# Patient Record
Sex: Male | Born: 1945 | Race: White | Hispanic: No | Marital: Married | Smoking: Former smoker
Health system: Southern US, Community
[De-identification: ages and names within clinical notes are randomized; demographics above are authoritative.]

## PROBLEM LIST (undated history)

## (undated) DIAGNOSIS — E119 Type 2 diabetes mellitus without complications: Secondary | ICD-10-CM

## (undated) DIAGNOSIS — E7800 Pure hypercholesterolemia, unspecified: Secondary | ICD-10-CM

## (undated) DIAGNOSIS — I10 Essential (primary) hypertension: Secondary | ICD-10-CM

## (undated) HISTORY — PX: CARPAL TUNNEL RELEASE: SHX101

## (undated) HISTORY — PX: CATARACT EXTRACTION: SUR2

## (undated) HISTORY — PX: KNEE SURGERY: SHX244

## (undated) HISTORY — PX: TONSILLECTOMY: SUR1361

## (undated) HISTORY — PX: SHOULDER SURGERY: SHX246

---

## 2015-02-09 DIAGNOSIS — G4733 Obstructive sleep apnea (adult) (pediatric): Secondary | ICD-10-CM | POA: Diagnosis not present

## 2015-02-15 DIAGNOSIS — G4733 Obstructive sleep apnea (adult) (pediatric): Secondary | ICD-10-CM | POA: Diagnosis not present

## 2015-02-15 DIAGNOSIS — J3089 Other allergic rhinitis: Secondary | ICD-10-CM | POA: Diagnosis not present

## 2015-02-20 DIAGNOSIS — R7303 Prediabetes: Secondary | ICD-10-CM | POA: Diagnosis not present

## 2015-02-20 DIAGNOSIS — G2581 Restless legs syndrome: Secondary | ICD-10-CM | POA: Diagnosis not present

## 2015-02-20 DIAGNOSIS — M7522 Bicipital tendinitis, left shoulder: Secondary | ICD-10-CM | POA: Diagnosis not present

## 2015-02-20 DIAGNOSIS — M7521 Bicipital tendinitis, right shoulder: Secondary | ICD-10-CM | POA: Diagnosis not present

## 2015-02-20 DIAGNOSIS — R5382 Chronic fatigue, unspecified: Secondary | ICD-10-CM | POA: Diagnosis not present

## 2015-02-20 DIAGNOSIS — E559 Vitamin D deficiency, unspecified: Secondary | ICD-10-CM | POA: Diagnosis not present

## 2015-03-04 DIAGNOSIS — R6889 Other general symptoms and signs: Secondary | ICD-10-CM | POA: Diagnosis not present

## 2015-03-04 DIAGNOSIS — R509 Fever, unspecified: Secondary | ICD-10-CM | POA: Diagnosis not present

## 2015-03-04 DIAGNOSIS — R062 Wheezing: Secondary | ICD-10-CM | POA: Diagnosis not present

## 2015-03-12 DIAGNOSIS — G4733 Obstructive sleep apnea (adult) (pediatric): Secondary | ICD-10-CM | POA: Diagnosis not present

## 2015-04-09 DIAGNOSIS — G4733 Obstructive sleep apnea (adult) (pediatric): Secondary | ICD-10-CM | POA: Diagnosis not present

## 2015-04-15 DIAGNOSIS — G4733 Obstructive sleep apnea (adult) (pediatric): Secondary | ICD-10-CM | POA: Diagnosis not present

## 2015-04-23 DIAGNOSIS — G4733 Obstructive sleep apnea (adult) (pediatric): Secondary | ICD-10-CM | POA: Diagnosis not present

## 2015-04-26 DIAGNOSIS — G4733 Obstructive sleep apnea (adult) (pediatric): Secondary | ICD-10-CM | POA: Diagnosis not present

## 2015-05-01 DIAGNOSIS — J301 Allergic rhinitis due to pollen: Secondary | ICD-10-CM | POA: Diagnosis not present

## 2015-05-01 DIAGNOSIS — B9789 Other viral agents as the cause of diseases classified elsewhere: Secondary | ICD-10-CM | POA: Diagnosis not present

## 2015-05-01 DIAGNOSIS — L989 Disorder of the skin and subcutaneous tissue, unspecified: Secondary | ICD-10-CM | POA: Diagnosis not present

## 2015-05-01 DIAGNOSIS — Z1283 Encounter for screening for malignant neoplasm of skin: Secondary | ICD-10-CM | POA: Diagnosis not present

## 2015-05-01 DIAGNOSIS — J069 Acute upper respiratory infection, unspecified: Secondary | ICD-10-CM | POA: Diagnosis not present

## 2015-05-10 DIAGNOSIS — G4733 Obstructive sleep apnea (adult) (pediatric): Secondary | ICD-10-CM | POA: Diagnosis not present

## 2015-05-15 DIAGNOSIS — B078 Other viral warts: Secondary | ICD-10-CM | POA: Diagnosis not present

## 2015-05-15 DIAGNOSIS — X32XXXA Exposure to sunlight, initial encounter: Secondary | ICD-10-CM | POA: Diagnosis not present

## 2015-05-15 DIAGNOSIS — D225 Melanocytic nevi of trunk: Secondary | ICD-10-CM | POA: Diagnosis not present

## 2015-05-15 DIAGNOSIS — Z1283 Encounter for screening for malignant neoplasm of skin: Secondary | ICD-10-CM | POA: Diagnosis not present

## 2015-05-15 DIAGNOSIS — L570 Actinic keratosis: Secondary | ICD-10-CM | POA: Diagnosis not present

## 2015-06-09 DIAGNOSIS — G4733 Obstructive sleep apnea (adult) (pediatric): Secondary | ICD-10-CM | POA: Diagnosis not present

## 2015-07-08 DIAGNOSIS — M9903 Segmental and somatic dysfunction of lumbar region: Secondary | ICD-10-CM | POA: Diagnosis not present

## 2015-07-08 DIAGNOSIS — M545 Low back pain: Secondary | ICD-10-CM | POA: Diagnosis not present

## 2015-07-08 DIAGNOSIS — M546 Pain in thoracic spine: Secondary | ICD-10-CM | POA: Diagnosis not present

## 2015-07-08 DIAGNOSIS — M9902 Segmental and somatic dysfunction of thoracic region: Secondary | ICD-10-CM | POA: Diagnosis not present

## 2015-07-10 DIAGNOSIS — G4733 Obstructive sleep apnea (adult) (pediatric): Secondary | ICD-10-CM | POA: Diagnosis not present

## 2015-07-10 DIAGNOSIS — M545 Low back pain: Secondary | ICD-10-CM | POA: Diagnosis not present

## 2015-07-10 DIAGNOSIS — M546 Pain in thoracic spine: Secondary | ICD-10-CM | POA: Diagnosis not present

## 2015-07-10 DIAGNOSIS — M9903 Segmental and somatic dysfunction of lumbar region: Secondary | ICD-10-CM | POA: Diagnosis not present

## 2015-07-10 DIAGNOSIS — M9902 Segmental and somatic dysfunction of thoracic region: Secondary | ICD-10-CM | POA: Diagnosis not present

## 2015-07-15 DIAGNOSIS — M9902 Segmental and somatic dysfunction of thoracic region: Secondary | ICD-10-CM | POA: Diagnosis not present

## 2015-07-15 DIAGNOSIS — M545 Low back pain: Secondary | ICD-10-CM | POA: Diagnosis not present

## 2015-07-15 DIAGNOSIS — M546 Pain in thoracic spine: Secondary | ICD-10-CM | POA: Diagnosis not present

## 2015-07-15 DIAGNOSIS — M9903 Segmental and somatic dysfunction of lumbar region: Secondary | ICD-10-CM | POA: Diagnosis not present

## 2015-07-17 DIAGNOSIS — M546 Pain in thoracic spine: Secondary | ICD-10-CM | POA: Diagnosis not present

## 2015-07-17 DIAGNOSIS — M545 Low back pain: Secondary | ICD-10-CM | POA: Diagnosis not present

## 2015-07-17 DIAGNOSIS — M9903 Segmental and somatic dysfunction of lumbar region: Secondary | ICD-10-CM | POA: Diagnosis not present

## 2015-07-17 DIAGNOSIS — M9902 Segmental and somatic dysfunction of thoracic region: Secondary | ICD-10-CM | POA: Diagnosis not present

## 2015-07-24 DIAGNOSIS — M546 Pain in thoracic spine: Secondary | ICD-10-CM | POA: Diagnosis not present

## 2015-07-24 DIAGNOSIS — M9903 Segmental and somatic dysfunction of lumbar region: Secondary | ICD-10-CM | POA: Diagnosis not present

## 2015-07-24 DIAGNOSIS — M9902 Segmental and somatic dysfunction of thoracic region: Secondary | ICD-10-CM | POA: Diagnosis not present

## 2015-07-24 DIAGNOSIS — M545 Low back pain: Secondary | ICD-10-CM | POA: Diagnosis not present

## 2015-07-29 DIAGNOSIS — M9903 Segmental and somatic dysfunction of lumbar region: Secondary | ICD-10-CM | POA: Diagnosis not present

## 2015-07-29 DIAGNOSIS — M9902 Segmental and somatic dysfunction of thoracic region: Secondary | ICD-10-CM | POA: Diagnosis not present

## 2015-07-29 DIAGNOSIS — M545 Low back pain: Secondary | ICD-10-CM | POA: Diagnosis not present

## 2015-07-29 DIAGNOSIS — M546 Pain in thoracic spine: Secondary | ICD-10-CM | POA: Diagnosis not present

## 2015-07-31 DIAGNOSIS — M9903 Segmental and somatic dysfunction of lumbar region: Secondary | ICD-10-CM | POA: Diagnosis not present

## 2015-07-31 DIAGNOSIS — M9902 Segmental and somatic dysfunction of thoracic region: Secondary | ICD-10-CM | POA: Diagnosis not present

## 2015-07-31 DIAGNOSIS — M545 Low back pain: Secondary | ICD-10-CM | POA: Diagnosis not present

## 2015-07-31 DIAGNOSIS — M546 Pain in thoracic spine: Secondary | ICD-10-CM | POA: Diagnosis not present

## 2015-07-31 DIAGNOSIS — G4733 Obstructive sleep apnea (adult) (pediatric): Secondary | ICD-10-CM | POA: Diagnosis not present

## 2015-08-06 DIAGNOSIS — G4733 Obstructive sleep apnea (adult) (pediatric): Secondary | ICD-10-CM | POA: Diagnosis not present

## 2015-08-07 DIAGNOSIS — M9902 Segmental and somatic dysfunction of thoracic region: Secondary | ICD-10-CM | POA: Diagnosis not present

## 2015-08-07 DIAGNOSIS — M546 Pain in thoracic spine: Secondary | ICD-10-CM | POA: Diagnosis not present

## 2015-08-07 DIAGNOSIS — M9903 Segmental and somatic dysfunction of lumbar region: Secondary | ICD-10-CM | POA: Diagnosis not present

## 2015-08-07 DIAGNOSIS — M545 Low back pain: Secondary | ICD-10-CM | POA: Diagnosis not present

## 2015-08-09 DIAGNOSIS — G4733 Obstructive sleep apnea (adult) (pediatric): Secondary | ICD-10-CM | POA: Diagnosis not present

## 2015-08-14 DIAGNOSIS — M545 Low back pain: Secondary | ICD-10-CM | POA: Diagnosis not present

## 2015-08-14 DIAGNOSIS — M546 Pain in thoracic spine: Secondary | ICD-10-CM | POA: Diagnosis not present

## 2015-08-14 DIAGNOSIS — M9902 Segmental and somatic dysfunction of thoracic region: Secondary | ICD-10-CM | POA: Diagnosis not present

## 2015-08-14 DIAGNOSIS — M9903 Segmental and somatic dysfunction of lumbar region: Secondary | ICD-10-CM | POA: Diagnosis not present

## 2015-09-09 DIAGNOSIS — G4733 Obstructive sleep apnea (adult) (pediatric): Secondary | ICD-10-CM | POA: Diagnosis not present

## 2015-10-02 DIAGNOSIS — R079 Chest pain, unspecified: Secondary | ICD-10-CM | POA: Diagnosis not present

## 2015-10-10 DIAGNOSIS — G4733 Obstructive sleep apnea (adult) (pediatric): Secondary | ICD-10-CM | POA: Diagnosis not present

## 2015-10-23 DIAGNOSIS — R079 Chest pain, unspecified: Secondary | ICD-10-CM | POA: Diagnosis not present

## 2015-10-24 DIAGNOSIS — Z23 Encounter for immunization: Secondary | ICD-10-CM | POA: Diagnosis not present

## 2015-11-12 DIAGNOSIS — G4733 Obstructive sleep apnea (adult) (pediatric): Secondary | ICD-10-CM | POA: Diagnosis not present

## 2015-11-21 DIAGNOSIS — E7801 Familial hypercholesterolemia: Secondary | ICD-10-CM | POA: Diagnosis not present

## 2015-12-23 DIAGNOSIS — J0140 Acute pansinusitis, unspecified: Secondary | ICD-10-CM | POA: Diagnosis not present

## 2015-12-23 DIAGNOSIS — J209 Acute bronchitis, unspecified: Secondary | ICD-10-CM | POA: Diagnosis not present

## 2015-12-23 DIAGNOSIS — J302 Other seasonal allergic rhinitis: Secondary | ICD-10-CM | POA: Diagnosis not present

## 2015-12-30 DIAGNOSIS — E119 Type 2 diabetes mellitus without complications: Secondary | ICD-10-CM | POA: Diagnosis not present

## 2015-12-30 DIAGNOSIS — E785 Hyperlipidemia, unspecified: Secondary | ICD-10-CM | POA: Diagnosis not present

## 2015-12-30 DIAGNOSIS — N401 Enlarged prostate with lower urinary tract symptoms: Secondary | ICD-10-CM | POA: Diagnosis not present

## 2015-12-30 DIAGNOSIS — Z9989 Dependence on other enabling machines and devices: Secondary | ICD-10-CM | POA: Diagnosis not present

## 2015-12-30 DIAGNOSIS — G4733 Obstructive sleep apnea (adult) (pediatric): Secondary | ICD-10-CM | POA: Diagnosis not present

## 2015-12-30 DIAGNOSIS — Z87898 Personal history of other specified conditions: Secondary | ICD-10-CM | POA: Diagnosis not present

## 2015-12-30 DIAGNOSIS — F419 Anxiety disorder, unspecified: Secondary | ICD-10-CM | POA: Diagnosis not present

## 2015-12-30 DIAGNOSIS — N138 Other obstructive and reflux uropathy: Secondary | ICD-10-CM | POA: Diagnosis not present

## 2015-12-30 DIAGNOSIS — E559 Vitamin D deficiency, unspecified: Secondary | ICD-10-CM | POA: Diagnosis not present

## 2015-12-30 DIAGNOSIS — I10 Essential (primary) hypertension: Secondary | ICD-10-CM | POA: Diagnosis not present

## 2016-01-01 DIAGNOSIS — E785 Hyperlipidemia, unspecified: Secondary | ICD-10-CM | POA: Diagnosis not present

## 2016-01-01 DIAGNOSIS — E559 Vitamin D deficiency, unspecified: Secondary | ICD-10-CM | POA: Diagnosis not present

## 2016-01-01 DIAGNOSIS — Z87898 Personal history of other specified conditions: Secondary | ICD-10-CM | POA: Diagnosis not present

## 2016-01-01 DIAGNOSIS — E119 Type 2 diabetes mellitus without complications: Secondary | ICD-10-CM | POA: Diagnosis not present

## 2016-01-01 DIAGNOSIS — I10 Essential (primary) hypertension: Secondary | ICD-10-CM | POA: Diagnosis not present

## 2016-01-01 DIAGNOSIS — N138 Other obstructive and reflux uropathy: Secondary | ICD-10-CM | POA: Diagnosis not present

## 2016-01-01 DIAGNOSIS — N401 Enlarged prostate with lower urinary tract symptoms: Secondary | ICD-10-CM | POA: Diagnosis not present

## 2016-01-01 DIAGNOSIS — Z125 Encounter for screening for malignant neoplasm of prostate: Secondary | ICD-10-CM | POA: Diagnosis not present

## 2016-01-28 DIAGNOSIS — M19071 Primary osteoarthritis, right ankle and foot: Secondary | ICD-10-CM | POA: Diagnosis not present

## 2016-01-29 DIAGNOSIS — M19071 Primary osteoarthritis, right ankle and foot: Secondary | ICD-10-CM | POA: Diagnosis not present

## 2016-02-06 DIAGNOSIS — R509 Fever, unspecified: Secondary | ICD-10-CM | POA: Diagnosis not present

## 2016-02-06 DIAGNOSIS — R05 Cough: Secondary | ICD-10-CM | POA: Diagnosis not present

## 2016-02-26 DIAGNOSIS — R9431 Abnormal electrocardiogram [ECG] [EKG]: Secondary | ICD-10-CM | POA: Diagnosis not present

## 2016-02-26 DIAGNOSIS — R0981 Nasal congestion: Secondary | ICD-10-CM | POA: Diagnosis not present

## 2016-02-26 DIAGNOSIS — R05 Cough: Secondary | ICD-10-CM | POA: Diagnosis not present

## 2016-02-26 DIAGNOSIS — R0602 Shortness of breath: Secondary | ICD-10-CM | POA: Diagnosis not present

## 2016-02-26 DIAGNOSIS — Z0000 Encounter for general adult medical examination without abnormal findings: Secondary | ICD-10-CM | POA: Diagnosis not present

## 2016-02-26 DIAGNOSIS — E559 Vitamin D deficiency, unspecified: Secondary | ICD-10-CM | POA: Diagnosis not present

## 2016-03-06 DIAGNOSIS — R05 Cough: Secondary | ICD-10-CM | POA: Diagnosis not present

## 2016-03-06 DIAGNOSIS — R0981 Nasal congestion: Secondary | ICD-10-CM | POA: Diagnosis not present

## 2016-03-12 DIAGNOSIS — E119 Type 2 diabetes mellitus without complications: Secondary | ICD-10-CM | POA: Diagnosis not present

## 2016-03-12 DIAGNOSIS — M21612 Bunion of left foot: Secondary | ICD-10-CM | POA: Diagnosis not present

## 2016-03-12 DIAGNOSIS — M21611 Bunion of right foot: Secondary | ICD-10-CM | POA: Diagnosis not present

## 2016-04-02 DIAGNOSIS — E119 Type 2 diabetes mellitus without complications: Secondary | ICD-10-CM | POA: Diagnosis not present

## 2016-04-02 DIAGNOSIS — Z961 Presence of intraocular lens: Secondary | ICD-10-CM | POA: Diagnosis not present

## 2016-04-02 DIAGNOSIS — H26492 Other secondary cataract, left eye: Secondary | ICD-10-CM | POA: Diagnosis not present

## 2016-04-02 DIAGNOSIS — H43393 Other vitreous opacities, bilateral: Secondary | ICD-10-CM | POA: Diagnosis not present

## 2016-04-02 DIAGNOSIS — H524 Presbyopia: Secondary | ICD-10-CM | POA: Diagnosis not present

## 2016-05-07 DIAGNOSIS — G4733 Obstructive sleep apnea (adult) (pediatric): Secondary | ICD-10-CM | POA: Diagnosis not present

## 2016-06-24 DIAGNOSIS — I10 Essential (primary) hypertension: Secondary | ICD-10-CM | POA: Diagnosis not present

## 2016-06-24 DIAGNOSIS — R7989 Other specified abnormal findings of blood chemistry: Secondary | ICD-10-CM | POA: Diagnosis not present

## 2016-06-24 DIAGNOSIS — E559 Vitamin D deficiency, unspecified: Secondary | ICD-10-CM | POA: Diagnosis not present

## 2016-06-24 DIAGNOSIS — Z0000 Encounter for general adult medical examination without abnormal findings: Secondary | ICD-10-CM | POA: Diagnosis not present

## 2016-07-01 DIAGNOSIS — E559 Vitamin D deficiency, unspecified: Secondary | ICD-10-CM | POA: Diagnosis not present

## 2016-07-01 DIAGNOSIS — Z9989 Dependence on other enabling machines and devices: Secondary | ICD-10-CM | POA: Diagnosis not present

## 2016-07-01 DIAGNOSIS — M79641 Pain in right hand: Secondary | ICD-10-CM | POA: Diagnosis not present

## 2016-07-01 DIAGNOSIS — I10 Essential (primary) hypertension: Secondary | ICD-10-CM | POA: Diagnosis not present

## 2016-07-01 DIAGNOSIS — F419 Anxiety disorder, unspecified: Secondary | ICD-10-CM | POA: Diagnosis not present

## 2016-07-01 DIAGNOSIS — R05 Cough: Secondary | ICD-10-CM | POA: Diagnosis not present

## 2016-07-01 DIAGNOSIS — E785 Hyperlipidemia, unspecified: Secondary | ICD-10-CM | POA: Diagnosis not present

## 2016-07-01 DIAGNOSIS — M6530 Trigger finger, unspecified finger: Secondary | ICD-10-CM | POA: Diagnosis not present

## 2016-07-01 DIAGNOSIS — G4733 Obstructive sleep apnea (adult) (pediatric): Secondary | ICD-10-CM | POA: Diagnosis not present

## 2016-07-01 DIAGNOSIS — R5383 Other fatigue: Secondary | ICD-10-CM | POA: Diagnosis not present

## 2016-07-01 DIAGNOSIS — M79642 Pain in left hand: Secondary | ICD-10-CM | POA: Diagnosis not present

## 2016-07-01 DIAGNOSIS — E119 Type 2 diabetes mellitus without complications: Secondary | ICD-10-CM | POA: Diagnosis not present

## 2016-07-06 DIAGNOSIS — N138 Other obstructive and reflux uropathy: Secondary | ICD-10-CM | POA: Diagnosis not present

## 2016-07-06 DIAGNOSIS — N401 Enlarged prostate with lower urinary tract symptoms: Secondary | ICD-10-CM | POA: Diagnosis not present

## 2016-07-06 DIAGNOSIS — N3281 Overactive bladder: Secondary | ICD-10-CM | POA: Diagnosis not present

## 2016-07-06 DIAGNOSIS — Z87898 Personal history of other specified conditions: Secondary | ICD-10-CM | POA: Diagnosis not present

## 2016-07-06 DIAGNOSIS — N529 Male erectile dysfunction, unspecified: Secondary | ICD-10-CM | POA: Diagnosis not present

## 2016-07-14 DIAGNOSIS — M26629 Arthralgia of temporomandibular joint, unspecified side: Secondary | ICD-10-CM | POA: Diagnosis not present

## 2016-07-23 DIAGNOSIS — G500 Trigeminal neuralgia: Secondary | ICD-10-CM | POA: Diagnosis not present

## 2016-07-23 DIAGNOSIS — R05 Cough: Secondary | ICD-10-CM | POA: Diagnosis not present

## 2016-07-28 DIAGNOSIS — M65332 Trigger finger, left middle finger: Secondary | ICD-10-CM | POA: Diagnosis not present

## 2016-07-28 DIAGNOSIS — M65331 Trigger finger, right middle finger: Secondary | ICD-10-CM | POA: Diagnosis not present

## 2016-08-14 DIAGNOSIS — E785 Hyperlipidemia, unspecified: Secondary | ICD-10-CM | POA: Diagnosis not present

## 2016-08-14 DIAGNOSIS — I10 Essential (primary) hypertension: Secondary | ICD-10-CM | POA: Diagnosis not present

## 2016-08-14 DIAGNOSIS — G4730 Sleep apnea, unspecified: Secondary | ICD-10-CM | POA: Diagnosis not present

## 2016-08-14 DIAGNOSIS — Z87891 Personal history of nicotine dependence: Secondary | ICD-10-CM | POA: Diagnosis not present

## 2016-08-14 DIAGNOSIS — E119 Type 2 diabetes mellitus without complications: Secondary | ICD-10-CM | POA: Diagnosis not present

## 2016-08-14 DIAGNOSIS — M65331 Trigger finger, right middle finger: Secondary | ICD-10-CM | POA: Diagnosis not present

## 2016-08-26 DIAGNOSIS — N401 Enlarged prostate with lower urinary tract symptoms: Secondary | ICD-10-CM | POA: Diagnosis not present

## 2016-08-26 DIAGNOSIS — Z87898 Personal history of other specified conditions: Secondary | ICD-10-CM | POA: Diagnosis not present

## 2016-08-26 DIAGNOSIS — N3281 Overactive bladder: Secondary | ICD-10-CM | POA: Diagnosis not present

## 2016-08-26 DIAGNOSIS — N529 Male erectile dysfunction, unspecified: Secondary | ICD-10-CM | POA: Diagnosis not present

## 2016-08-26 DIAGNOSIS — N138 Other obstructive and reflux uropathy: Secondary | ICD-10-CM | POA: Diagnosis not present

## 2016-08-27 DIAGNOSIS — G500 Trigeminal neuralgia: Secondary | ICD-10-CM | POA: Diagnosis not present

## 2016-08-28 DIAGNOSIS — G4733 Obstructive sleep apnea (adult) (pediatric): Secondary | ICD-10-CM | POA: Diagnosis not present

## 2016-09-02 DIAGNOSIS — G4733 Obstructive sleep apnea (adult) (pediatric): Secondary | ICD-10-CM | POA: Diagnosis not present

## 2016-09-04 DIAGNOSIS — R51 Headache: Secondary | ICD-10-CM | POA: Diagnosis not present

## 2016-09-04 DIAGNOSIS — G500 Trigeminal neuralgia: Secondary | ICD-10-CM | POA: Diagnosis not present

## 2016-09-30 DIAGNOSIS — Z87898 Personal history of other specified conditions: Secondary | ICD-10-CM | POA: Diagnosis not present

## 2016-09-30 DIAGNOSIS — N401 Enlarged prostate with lower urinary tract symptoms: Secondary | ICD-10-CM | POA: Diagnosis not present

## 2016-09-30 DIAGNOSIS — N3281 Overactive bladder: Secondary | ICD-10-CM | POA: Diagnosis not present

## 2016-09-30 DIAGNOSIS — N529 Male erectile dysfunction, unspecified: Secondary | ICD-10-CM | POA: Diagnosis not present

## 2016-10-26 DIAGNOSIS — E559 Vitamin D deficiency, unspecified: Secondary | ICD-10-CM | POA: Diagnosis not present

## 2016-10-26 DIAGNOSIS — E119 Type 2 diabetes mellitus without complications: Secondary | ICD-10-CM | POA: Diagnosis not present

## 2016-10-26 DIAGNOSIS — E785 Hyperlipidemia, unspecified: Secondary | ICD-10-CM | POA: Diagnosis not present

## 2016-10-26 DIAGNOSIS — I10 Essential (primary) hypertension: Secondary | ICD-10-CM | POA: Diagnosis not present

## 2016-10-26 DIAGNOSIS — N138 Other obstructive and reflux uropathy: Secondary | ICD-10-CM | POA: Diagnosis not present

## 2016-10-26 DIAGNOSIS — Z87898 Personal history of other specified conditions: Secondary | ICD-10-CM | POA: Diagnosis not present

## 2016-10-26 DIAGNOSIS — N401 Enlarged prostate with lower urinary tract symptoms: Secondary | ICD-10-CM | POA: Diagnosis not present

## 2016-10-27 DIAGNOSIS — R29810 Facial weakness: Secondary | ICD-10-CM | POA: Diagnosis not present

## 2016-10-27 DIAGNOSIS — G500 Trigeminal neuralgia: Secondary | ICD-10-CM | POA: Diagnosis not present

## 2016-10-28 DIAGNOSIS — E559 Vitamin D deficiency, unspecified: Secondary | ICD-10-CM | POA: Diagnosis not present

## 2016-10-28 DIAGNOSIS — Z23 Encounter for immunization: Secondary | ICD-10-CM | POA: Diagnosis not present

## 2016-10-28 DIAGNOSIS — E119 Type 2 diabetes mellitus without complications: Secondary | ICD-10-CM | POA: Diagnosis not present

## 2016-10-28 DIAGNOSIS — I10 Essential (primary) hypertension: Secondary | ICD-10-CM | POA: Diagnosis not present

## 2016-10-28 DIAGNOSIS — E785 Hyperlipidemia, unspecified: Secondary | ICD-10-CM | POA: Diagnosis not present

## 2016-11-11 DIAGNOSIS — G500 Trigeminal neuralgia: Secondary | ICD-10-CM | POA: Diagnosis not present

## 2016-11-11 DIAGNOSIS — R400 Somnolence: Secondary | ICD-10-CM | POA: Diagnosis not present

## 2016-11-23 DIAGNOSIS — M2012 Hallux valgus (acquired), left foot: Secondary | ICD-10-CM | POA: Diagnosis not present

## 2016-11-23 DIAGNOSIS — M2011 Hallux valgus (acquired), right foot: Secondary | ICD-10-CM | POA: Diagnosis not present

## 2016-11-23 DIAGNOSIS — M21619 Bunion of unspecified foot: Secondary | ICD-10-CM | POA: Diagnosis not present

## 2016-12-24 DIAGNOSIS — G500 Trigeminal neuralgia: Secondary | ICD-10-CM | POA: Diagnosis not present

## 2017-01-14 DIAGNOSIS — M7052 Other bursitis of knee, left knee: Secondary | ICD-10-CM | POA: Diagnosis not present

## 2017-01-18 DIAGNOSIS — S8992XS Unspecified injury of left lower leg, sequela: Secondary | ICD-10-CM | POA: Diagnosis not present

## 2017-01-18 DIAGNOSIS — M25562 Pain in left knee: Secondary | ICD-10-CM | POA: Diagnosis not present

## 2017-11-15 ENCOUNTER — Emergency Department (HOSPITAL_BASED_OUTPATIENT_CLINIC_OR_DEPARTMENT_OTHER): Payer: Non-veteran care

## 2017-11-15 ENCOUNTER — Encounter (HOSPITAL_BASED_OUTPATIENT_CLINIC_OR_DEPARTMENT_OTHER): Payer: Self-pay

## 2017-11-15 ENCOUNTER — Other Ambulatory Visit: Payer: Self-pay

## 2017-11-15 ENCOUNTER — Emergency Department (HOSPITAL_BASED_OUTPATIENT_CLINIC_OR_DEPARTMENT_OTHER)
Admission: EM | Admit: 2017-11-15 | Discharge: 2017-11-15 | Disposition: A | Discharge status: Home / Self Care | Payer: Non-veteran care | Attending: Emergency Medicine | Admitting: Emergency Medicine

## 2017-11-15 DIAGNOSIS — Z87891 Personal history of nicotine dependence: Secondary | ICD-10-CM | POA: Insufficient documentation

## 2017-11-15 DIAGNOSIS — R2242 Localized swelling, mass and lump, left lower limb: Secondary | ICD-10-CM | POA: Diagnosis not present

## 2017-11-15 DIAGNOSIS — I10 Essential (primary) hypertension: Secondary | ICD-10-CM | POA: Insufficient documentation

## 2017-11-15 DIAGNOSIS — R079 Chest pain, unspecified: Secondary | ICD-10-CM | POA: Diagnosis not present

## 2017-11-15 DIAGNOSIS — E119 Type 2 diabetes mellitus without complications: Secondary | ICD-10-CM | POA: Diagnosis not present

## 2017-11-15 HISTORY — DX: Essential (primary) hypertension: I10

## 2017-11-15 HISTORY — DX: Pure hypercholesterolemia, unspecified: E78.00

## 2017-11-15 HISTORY — DX: Type 2 diabetes mellitus without complications: E11.9

## 2017-11-15 LAB — BASIC METABOLIC PANEL
Anion gap: 8 (ref 5–15)
BUN: 16 mg/dL (ref 8–23)
CALCIUM: 9.3 mg/dL (ref 8.9–10.3)
CHLORIDE: 96 mmol/L — AB (ref 98–111)
CO2: 30 mmol/L (ref 22–32)
Creatinine, Ser: 0.7 mg/dL (ref 0.61–1.24)
GFR calc non Af Amer: >60 mL/min (ref >60)
Glucose, Bld: 121 mg/dL — ABNORMAL HIGH (ref 70–99)
Potassium: 3.7 mmol/L (ref 3.5–5.1)
Sodium: 134 mmol/L — ABNORMAL LOW (ref 135–145)

## 2017-11-15 LAB — URINALYSIS, ROUTINE W REFLEX MICROSCOPIC
Bilirubin Urine: NEGATIVE
Glucose, UA: NEGATIVE mg/dL
Hgb urine dipstick: NEGATIVE
KETONES UR: 15 mg/dL — AB
Nitrite: NEGATIVE
PH: 6.5 (ref 5.0–8.0)
Protein, ur: NEGATIVE mg/dL
Specific Gravity, Urine: 1.025 (ref 1.005–1.030)

## 2017-11-15 LAB — CBC
HEMATOCRIT: 41.6 % (ref 39.0–52.0)
HEMOGLOBIN: 14.3 g/dL (ref 13.0–17.0)
MCH: 32.6 pg (ref 26.0–34.0)
MCHC: 34.4 g/dL (ref 30.0–36.0)
MCV: 94.8 fL (ref 80.0–100.0)
NRBC: 0 % (ref 0.0–0.2)
Platelets: 229 10*3/uL (ref 150–400)
RBC: 4.39 MIL/uL (ref 4.22–5.81)
RDW: 12.3 % (ref 11.5–15.5)
WBC: 8.4 10*3/uL (ref 4.0–10.5)

## 2017-11-15 LAB — URINALYSIS, MICROSCOPIC (REFLEX)

## 2017-11-15 LAB — TROPONIN I

## 2017-11-15 NOTE — ED Triage Notes (Signed)
C/o CP x 3 days-dizziness x 2 weeks-NAD-steady gait

## 2017-11-15 NOTE — ED Provider Notes (Signed)
[facility] EMERGENCY DEPARTMENT Provider Note   CSN: [id number] Arrival date & time: 11/15/17  1617     History   Chief Complaint Chief Complaint  Patient presents with  . Chest Pain    HPI Samuel Bolton is a 72 y.o. male.  Pt presents to the ED today with CP that has been going on for 3 days.  The pt also reports dizziness when he gets up at night for the past 2 weeks.  Pt said the cp is pressure.  He went to his pcp at the [place].  They gave him ASA and nitro.  Nitro relieved his CP.  He does not feel dizzy now, but says that he gets dizzy when he stands up.  He feels like he is so off balance that he needs to sit down.  He has ringing in his ears and hearing loss, but those have been going on for years.  He has had a stress test about 2 years ago, but no cath.  He also said his left leg was swollen this morning.  No pain.  No sob.     Past Medical History:  Diagnosis Date  . Diabetes mellitus without complication ([place])   . High cholesterol   . Hypertension     There are no active problems to display for this patient.   Past Surgical History:  Procedure Laterality Date  . CARPAL TUNNEL RELEASE    . CATARACT EXTRACTION    . KNEE SURGERY    . SHOULDER SURGERY    . TONSILLECTOMY          Home Medications    Prior to Admission medications   Not on File    Family History No family history on file.  Social History Social History   Tobacco Use  . Smoking status: Former Research scientist (life sciences)  . Smokeless tobacco: Never Used  Substance Use Topics  . Alcohol use: Yes    Comment: occ  . Drug use: Never     Allergies   Patient has no known allergies.   Review of Systems Review of Systems  Cardiovascular: Positive for chest pain.  Neurological: Positive for dizziness.  All other systems reviewed and are negative.    Physical Exam Updated Vital Signs BP (!) 147/105 (BP Location: Left Arm)   Pulse 69   Temp 98 F (36.7 C) (Oral)   Resp 18   Ht 5'  7" (1.702 m)   Wt 84.4 kg   SpO2 95%   BMI 29.13 kg/m   Physical Exam  Constitutional: He is oriented to person, place, and time. He appears well-developed and well-nourished.  HENT:  Head: Normocephalic and atraumatic.  Eyes: Pupils are equal, round, and reactive to light. EOM are normal.  Neck: Normal range of motion. Neck supple.  Cardiovascular: Normal rate, regular rhythm, intact distal pulses and normal pulses.  Pulmonary/Chest: Effort normal and breath sounds normal.  Abdominal: Soft. Bowel sounds are normal.  Musculoskeletal: Normal range of motion.       Right lower leg: Normal.       Left lower leg: Normal.  Neurological: He is alert and oriented to person, place, and time.  Skin: Skin is warm. Capillary refill takes less than 2 seconds.  Psychiatric: He has a normal mood and affect. His behavior is normal.  Nursing note and vitals reviewed.    ED Treatments / Results  Labs (all labs ordered are listed, but only abnormal results are displayed) Labs Reviewed  BASIC  METABOLIC PANEL - Abnormal; Notable for the following components:      Result Value   Sodium 134 (*)    Chloride 96 (*)    Glucose, Bld 121 (*)    All other components within normal limits  URINALYSIS, ROUTINE W REFLEX MICROSCOPIC - Abnormal; Notable for the following components:   Ketones, ur 15 (*)    Leukocytes, UA TRACE (*)    All other components within normal limits  URINALYSIS, MICROSCOPIC (REFLEX) - Abnormal; Notable for the following components:   Bacteria, UA FEW (*)    All other components within normal limits  TROPONIN I  CBC  TROPONIN I    EKG EKG Interpretation  Date/Time:  Monday November 15 2017 16:23:45 EDT Ventricular Rate:  74 PR Interval:  178 QRS Duration: 70 QT Interval:  392 QTC Calculation: 435 R Axis:   -22 Text Interpretation:  Normal sinus rhythm Inferior infarct , age undetermined Anterior infarct , age undetermined Abnormal ECG No old tracing to compare Confirmed  by Isla Pence [phone]) on 11/15/2017 4:26:12 PM   Radiology Dg Chest 2 View  Result Date: 11/15/2017 CLINICAL DATA:  Pt c/o central chest pain x 3 days and dizziness x 2 weeks. Hx of htn and dm. EXAM: CHEST - 2 VIEW FINDINGS: Prominence of the ascending aorta, probably from tortuosity, less likely aneurysm. Thoracic spondylosis. The lungs appear clear. No pleural effusion. IMPRESSION: 1. Prominent ascending thoracic aorta, probably from tortuosity, less likely from ascending aortic aneurysm. Chest CT could differentiate if clinically warranted. There is no cardiomegaly. Electronically Signed   By: Van Clines M.D.   On: 11/15/2017 18:12   US Venous Img Lower Unilateral Left  Result Date: 11/15/2017 CLINICAL DATA:  Acute left lower extremity swelling. EXAM: Left LOWER EXTREMITY VENOUS DOPPLER ULTRASOUND TECHNIQUE: Gray-scale sonography with graded compression, as well as color Doppler and duplex ultrasound were performed to evaluate the lower extremity deep venous systems from the level of the common femoral vein and including the common femoral, femoral, profunda femoral, popliteal and calf veins including the posterior tibial, peroneal and gastrocnemius veins when visible. The superficial great saphenous vein was also interrogated. Spectral Doppler was utilized to evaluate flow at rest and with distal augmentation maneuvers in the common femoral, femoral and popliteal veins. COMPARISON:  None. FINDINGS: Contralateral Common Femoral Vein: Respiratory phasicity is normal and symmetric with the symptomatic side. No evidence of thrombus. Normal compressibility. Common Femoral Vein: No evidence of thrombus. Normal compressibility, respiratory phasicity and response to augmentation. Saphenofemoral Junction: No evidence of thrombus. Normal compressibility and flow on color Doppler imaging. Profunda Femoral Vein: No evidence of thrombus. Normal compressibility and flow on color Doppler imaging.  Femoral Vein: No evidence of thrombus. Normal compressibility, respiratory phasicity and response to augmentation. Popliteal Vein: No evidence of thrombus. Normal compressibility, respiratory phasicity and response to augmentation. Calf Veins: No evidence of thrombus. Normal compressibility and flow on color Doppler imaging. Superficial Great Saphenous Vein: No evidence of thrombus. Normal compressibility. Venous Reflux:  None. Other Findings:  None. IMPRESSION: No evidence of deep venous thrombosis. Electronically Signed   By: Marijo Conception, M.D.   On: 11/15/2017 18:39    Procedures Procedures (including critical care time)  Medications Ordered in ED Medications - No data to display   Initial Impression / Assessment and Plan / ED Course  I have reviewed the triage vital signs and the nursing notes.  Pertinent labs & imaging results that were available during my care of the  patient were reviewed by me and considered in my medical decision making (see chart for details).    Pt's 1st troponin is negative.  His [place] LLE is negative for DVT.  Dizziness at night is likely orthostatic.  He is mildly orthostatic here by BP.  He has multiple risk factors for CAD.  Heart score of 5.  I wanted to admit this patient over night, but he refuses to stay.  He feels better now and has a cardiologist.  He and his wife promise to call his cardiologist in the morning.  Pt encouraged to take asa daily.  Return immediately if cp returns.   Final Clinical Impressions(s) / ED Diagnoses   Final diagnoses:  Chest pain, unspecified type    ED Discharge Orders    None       Isla Pence, MD 11/15/17 1905

## 2017-11-15 NOTE — Discharge Instructions (Signed)
Take ASA daily.  Call your cardiologist in the morning and let him know you had to come to the hospital with chest pain.

## 2018-12-11 ENCOUNTER — Emergency Department (HOSPITAL_BASED_OUTPATIENT_CLINIC_OR_DEPARTMENT_OTHER)
Admission: EM | Admit: 2018-12-11 | Discharge: 2018-12-11 | Disposition: A | Discharge status: Other Acute Inpatient Hospital | Payer: No Typology Code available for payment source | Attending: Emergency Medicine | Admitting: Emergency Medicine

## 2018-12-11 ENCOUNTER — Other Ambulatory Visit: Payer: Self-pay

## 2018-12-11 ENCOUNTER — Encounter (HOSPITAL_BASED_OUTPATIENT_CLINIC_OR_DEPARTMENT_OTHER): Payer: Self-pay | Admitting: Emergency Medicine

## 2018-12-11 DIAGNOSIS — Z87891 Personal history of nicotine dependence: Secondary | ICD-10-CM | POA: Insufficient documentation

## 2018-12-11 DIAGNOSIS — E119 Type 2 diabetes mellitus without complications: Secondary | ICD-10-CM | POA: Diagnosis not present

## 2018-12-11 DIAGNOSIS — R1110 Vomiting, unspecified: Secondary | ICD-10-CM | POA: Insufficient documentation

## 2018-12-11 DIAGNOSIS — H5712 Ocular pain, left eye: Secondary | ICD-10-CM | POA: Diagnosis present

## 2018-12-11 DIAGNOSIS — H409 Unspecified glaucoma: Secondary | ICD-10-CM | POA: Diagnosis not present

## 2018-12-11 DIAGNOSIS — I10 Essential (primary) hypertension: Secondary | ICD-10-CM | POA: Diagnosis not present

## 2018-12-11 MED ORDER — FLUORESCEIN SODIUM 1 MG OP STRP
1.0000 | ORAL_STRIP | Freq: Once | OPHTHALMIC | Status: AC
  Administered 2018-12-11: 13:00:00 1 via OPHTHALMIC
  Filled 2018-12-11: qty 1

## 2018-12-11 MED ORDER — TETRACAINE HCL 0.5 % OP SOLN
2.0000 [drp] | Freq: Once | OPHTHALMIC | Status: AC
  Administered 2018-12-11: 13:00:00 2 [drp] via OPHTHALMIC
  Filled 2018-12-11: qty 4

## 2018-12-11 NOTE — ED Notes (Signed)
Spoke with Ivin Booty at [facility] for consult Ophthalmology, for Dr Jeneen Rinks Trinna Post

## 2018-12-11 NOTE — ED Notes (Signed)
Pt verbalized understanding to go directly to [facility] ED. D/c with family member to drive

## 2018-12-11 NOTE — ED Notes (Signed)
Family at bedside. 

## 2018-12-11 NOTE — ED Provider Notes (Signed)
[facility] EMERGENCY DEPARTMENT Provider Note   CSN: [id number] Arrival date & time: 12/11/18  1226     History   Chief Complaint Chief Complaint  Patient presents with  . Eye Problem  . Emesis    HPI Samuel Bolton is a 73 y.o. male.     HPI Patient presents with eye pain and redness.  Began yesterday.  5 days ago had laser procedure on his left eye by Dr. Trinna Post with a YAG capsulotomy.  Has been doing well with no vision changes but then 2 days ago developed redness and pain in the eye with some blurred vision.  States he took some Tylenol nap and it improved.  Went out to eat.  States however then developed nausea vomiting increased pain in the eye.  Now decreased vision in the eye.  States he has not called ophthalmology.  No fevers.  No chest or abdominal pain. Past Medical History:  Diagnosis Date  . Diabetes mellitus without complication ([place])   . High cholesterol   . Hypertension     There are no active problems to display for this patient.   Past Surgical History:  Procedure Laterality Date  . CARPAL TUNNEL RELEASE    . CATARACT EXTRACTION    . KNEE SURGERY    . SHOULDER SURGERY    . TONSILLECTOMY          Home Medications    Prior to Admission medications   Not on File    Family History No family history on file.  Social History Social History   Tobacco Use  . Smoking status: Former Research scientist (life sciences)  . Smokeless tobacco: Never Used  Substance Use Topics  . Alcohol use: Yes    Comment: occ  . Drug use: Never     Allergies   Patient has no known allergies.   Review of Systems Review of Systems  Constitutional: Negative for appetite change.  HENT: Negative for congestion.   Eyes: Positive for pain, redness and visual disturbance. Negative for photophobia and discharge.  Respiratory: Negative for shortness of breath.   Cardiovascular: Negative for chest pain.     Physical Exam Updated Vital Signs BP (!) 167/84 (BP Location:  Right Arm)   Pulse 91   Temp 98.8 F (37.1 C) (Oral)   Resp 19   SpO2 99%   Physical Exam Vitals signs and nursing note reviewed.  Constitutional:      Appearance: Normal appearance.  Eyes:     Comments: Pupils somewhat constricted on right side.  Slightly larger on left but somewhat sluggish.  Conjunctiva injected.  Neck:     Musculoskeletal: Neck supple.  Cardiovascular:     Rate and Rhythm: Regular rhythm.  Skin:    General: Skin is warm.     Capillary Refill: Capillary refill takes less than 2 seconds.  Neurological:     Mental Status: Mental status is at baseline.   Intraocular pressure of 19 on the right side and 47 and 50 on the left.   ED Treatments / Results  Labs (all labs ordered are listed, but only abnormal results are displayed) Labs Reviewed - No data to display  EKG None  Radiology No results found.  Procedures Procedures (including critical care time)  Medications Ordered in ED Medications  tetracaine (PONTOCAINE) 0.5 % ophthalmic solution 2 drop (2 drops Left Eye Given by Other 12/11/18 1255)  fluorescein ophthalmic strip 1 strip (1 strip Left Eye Given by Other 12/11/18 1255)  Initial Impression / Assessment and Plan / ED Course  I have reviewed the triage vital signs and the nursing notes.  Pertinent labs & imaging results that were available during my care of the patient were reviewed by me and considered in my medical decision making (see chart for details).       Patient with left eye pain and redness with decreased vision after recent YAG capsulotomy.  Glaucoma on left.  Intraocular pressure of 50.  Discussed with Dr. Isabel Caprice at [facility].  Recommends transfer to the ER to be seen by him.  Discussed with patient and they will go by private vehicle.  Discussed with the transfer service and did not need a separate accepting physician.  Final Clinical Impressions(s) / ED Diagnoses   Final diagnoses:  Glaucoma of left  eye, unspecified glaucoma type    ED Discharge Orders    None       Davonna Belling, MD 12/11/18 1428

## 2018-12-11 NOTE — ED Triage Notes (Addendum)
L eye redness and pain since yesterday. Had an eye procedure last Tuesday to clean his lens implant. Also c/o vomiting.

## 2018-12-11 NOTE — Discharge Instructions (Addendum)
Go to the [facility] emergency department at [place] are to see Dr. Isabel Caprice from ophthalmology there.

## 2018-12-11 NOTE — ED Notes (Signed)
ED Provider at bedside. 

## 2020-07-13 IMAGING — US US EXTREM LOW VENOUS*L*
1 series · 13 of 24 positions shown · non-contrast
Comparison: None.

CLINICAL DATA: Acute left lower extremity swelling.



[Series 1: us extrem low venous*left* · 0.08mm/px · 13 of 32 slices shown]
[im 1/32]
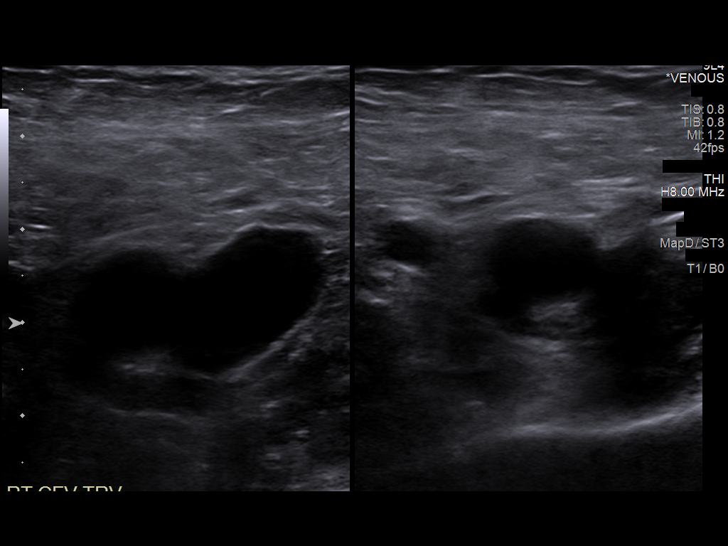
[im 3/32]
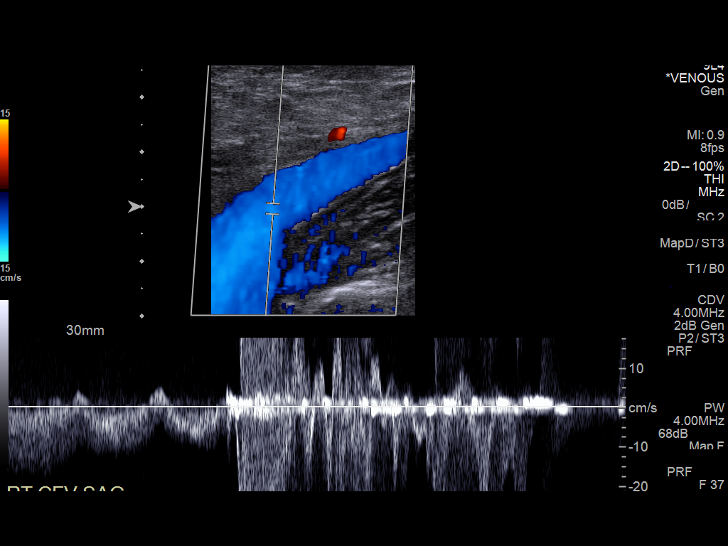
[im 6/32]
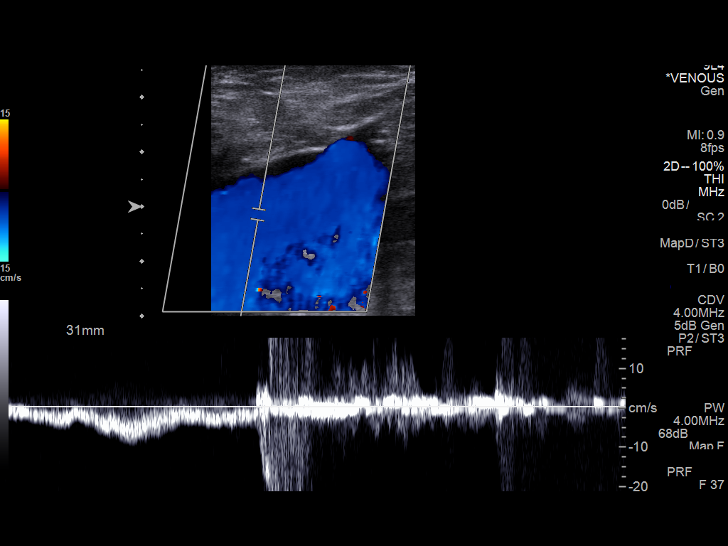
[im 9/32]
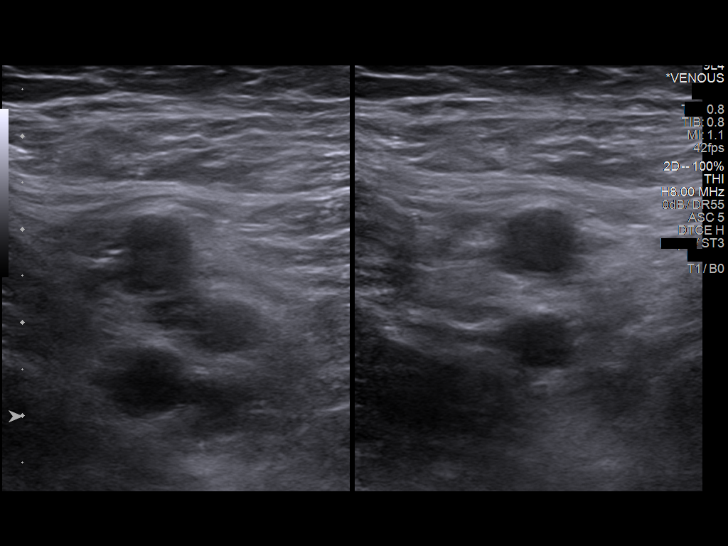
[im 11/32]
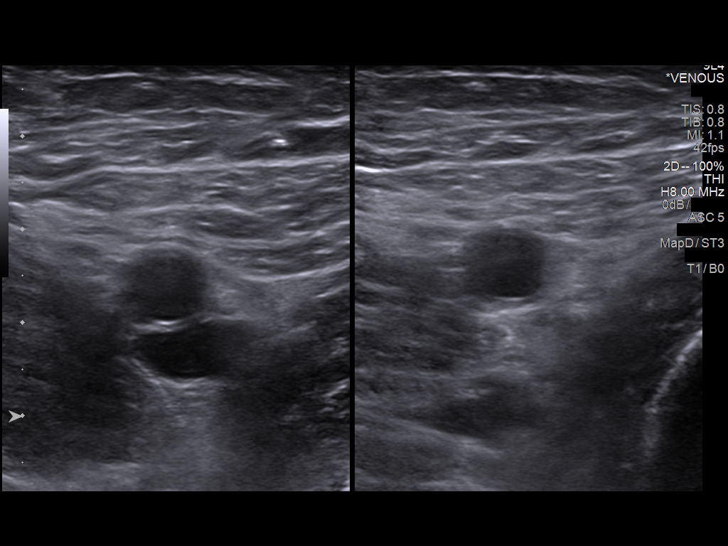
[im 14/32]
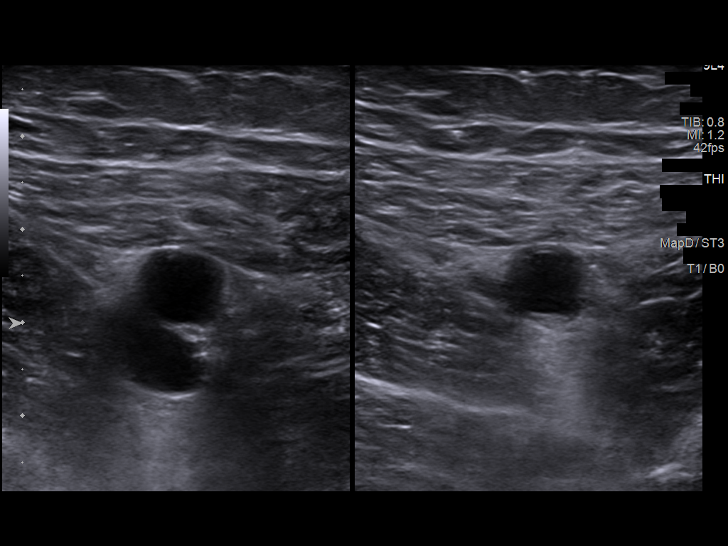
[im 17/32]
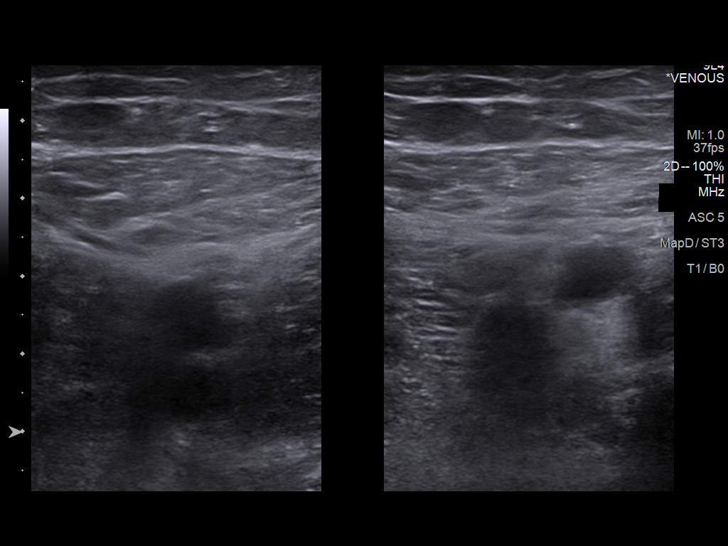
[im 18/32]
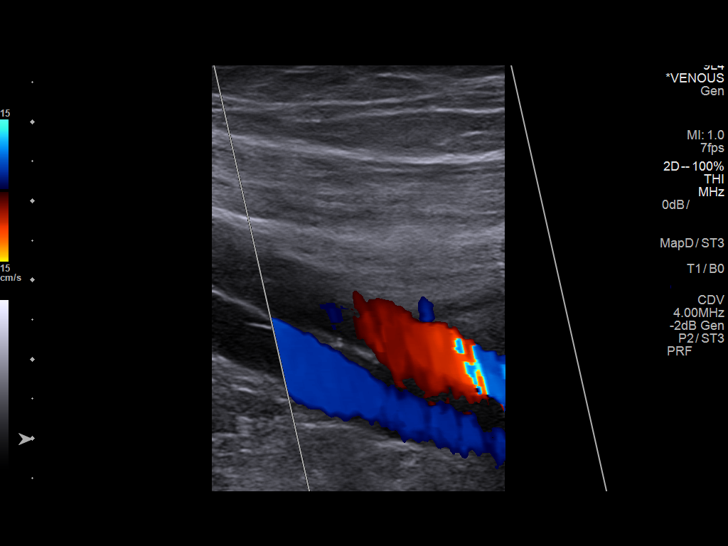
[im 21/32]
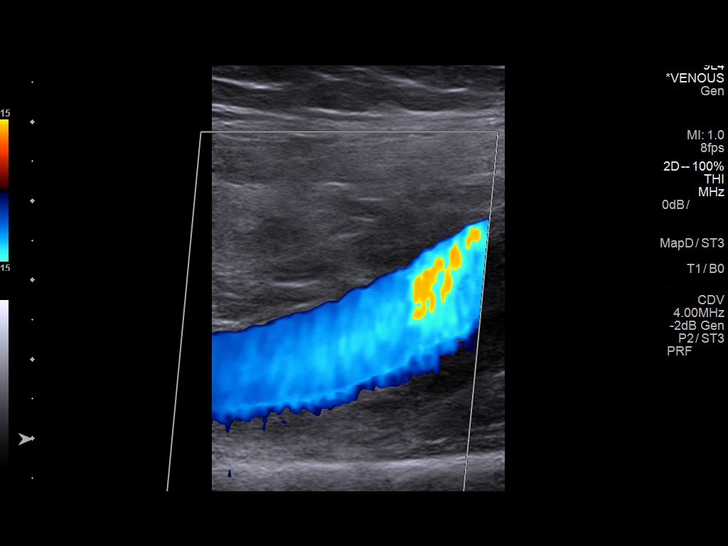
[im 23/32]
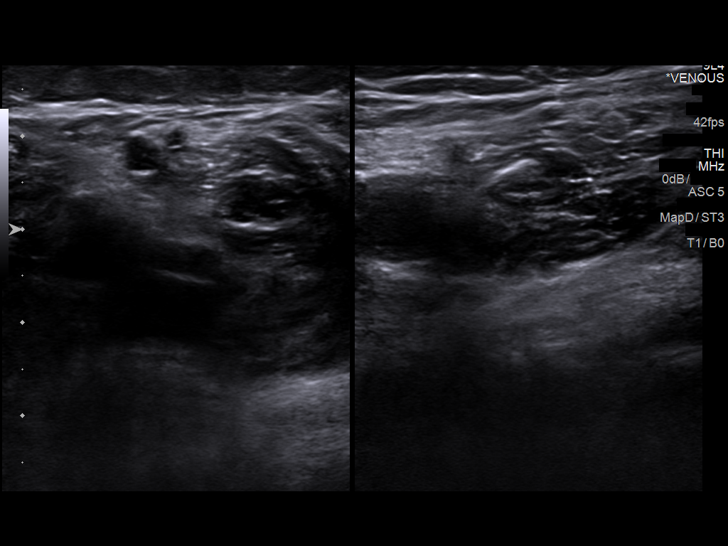
[im 26/32]
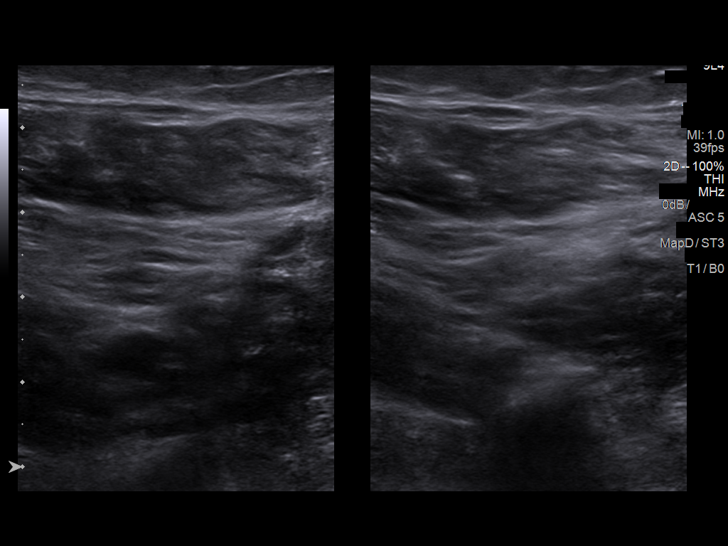
[im 29/32]
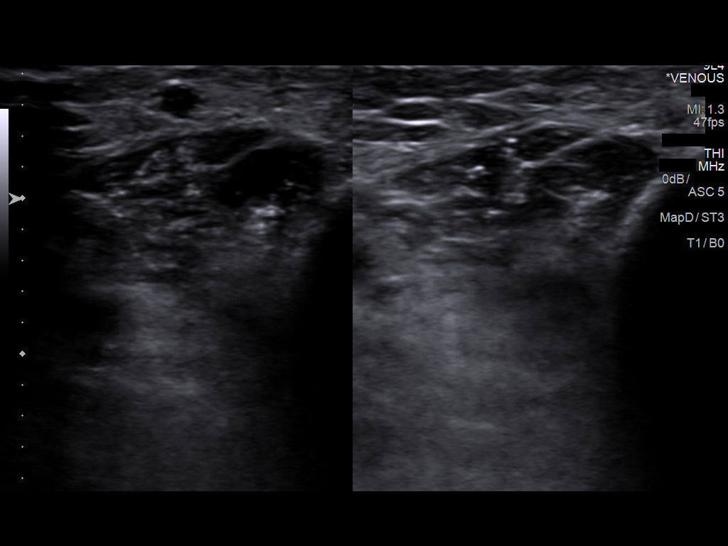
[im 32/32]
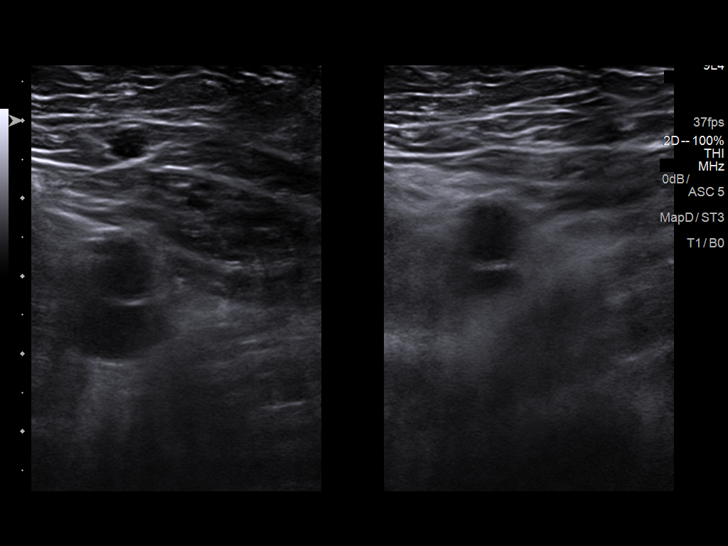

[13 of 24 positions shown; findings below may reference images not displayed]

FINDINGS: Contralateral Common Femoral Vein: Respiratory phasicity is normal
and symmetric with the symptomatic side. No evidence of thrombus.
Normal compressibility.

Common Femoral Vein: No evidence of thrombus. Normal
compressibility, respiratory phasicity and response to augmentation.

Saphenofemoral Junction: No evidence of thrombus. Normal
compressibility and flow on color Doppler imaging.

Profunda Femoral Vein: No evidence of thrombus. Normal
compressibility and flow on color Doppler imaging.

Femoral Vein: No evidence of thrombus. Normal compressibility,
respiratory phasicity and response to augmentation.

Popliteal Vein: No evidence of thrombus. Normal compressibility,
respiratory phasicity and response to augmentation.

Calf Veins: No evidence of thrombus. Normal compressibility and flow
on color Doppler imaging.

Superficial Great Saphenous Vein: No evidence of thrombus. Normal
compressibility.

Venous Reflux:  None.

Other Findings:  None.
IMPRESSION: No evidence of deep venous thrombosis.
